# Patient Record
Sex: Female | Born: 1965 | Hispanic: Yes | Marital: Single
Health system: Southern US, Community
[De-identification: ages and names within clinical notes are randomized; demographics above are authoritative.]

---

## 2005-03-31 ENCOUNTER — Emergency Department: Payer: Self-pay | Admitting: Emergency Medicine

## 2012-02-03 ENCOUNTER — Observation Stay: Payer: Self-pay | Admitting: Surgery

## 2012-02-03 LAB — TROPONIN I: Troponin-I: <0.02 ng/mL

## 2012-02-03 LAB — CBC
HGB: 13.8 g/dL (ref 12.0–16.0)
MCHC: 32 g/dL (ref 32.0–36.0)
MCV: 94 fL (ref 80–100)
RBC: 4.59 10*6/uL (ref 3.80–5.20)
WBC: 12.5 10*3/uL — ABNORMAL HIGH (ref 3.6–11.0)

## 2012-02-03 LAB — URINALYSIS, COMPLETE
Bilirubin,UR: NEGATIVE
Glucose,UR: NEGATIVE mg/dL (ref 0–75)
Nitrite: NEGATIVE
RBC,UR: 1 /HPF (ref 0–5)
WBC UR: <1 /HPF (ref 0–5)

## 2012-02-03 LAB — COMPREHENSIVE METABOLIC PANEL
Alkaline Phosphatase: 113 U/L (ref 50–136)
Chloride: 103 mmol/L (ref 98–107)
Creatinine: 0.61 mg/dL (ref 0.60–1.30)
EGFR (African American): >60
EGFR (Non-African Amer.): >60
SGOT(AST): 18 U/L (ref 15–37)
SGPT (ALT): 23 U/L
Total Protein: 8.4 g/dL — ABNORMAL HIGH (ref 6.4–8.2)

## 2012-02-03 LAB — LIPASE, BLOOD: Lipase: 87 U/L (ref 73–393)

## 2012-10-30 IMAGING — US ABDOMEN ULTRASOUND LIMITED
1 series · 14 of 25 positions shown · non-contrast
Comparison: none

REASON FOR EXAM: RUQ abdominal pain
COMMENTS:   Body Site: GB and Fossa, CBD, Head of Pancreas

[Series 1: abdomen ultrasound limited · 0.25mm/px · 14 of 65 slices shown]
[im 1/65]
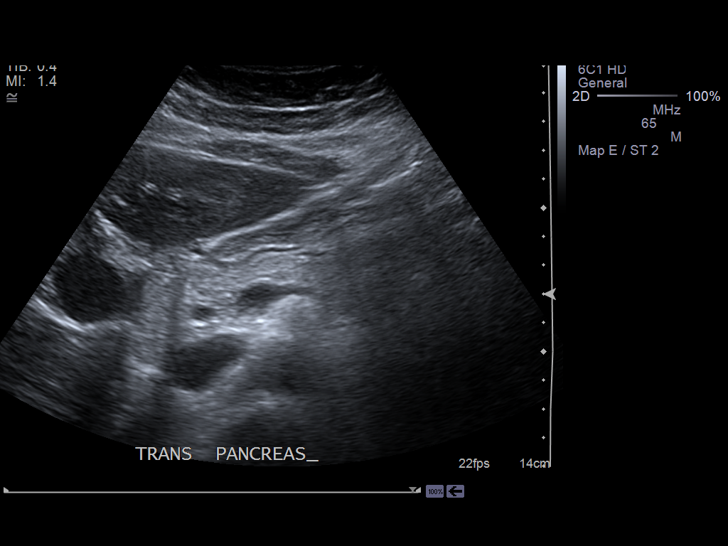
[im 6/65]
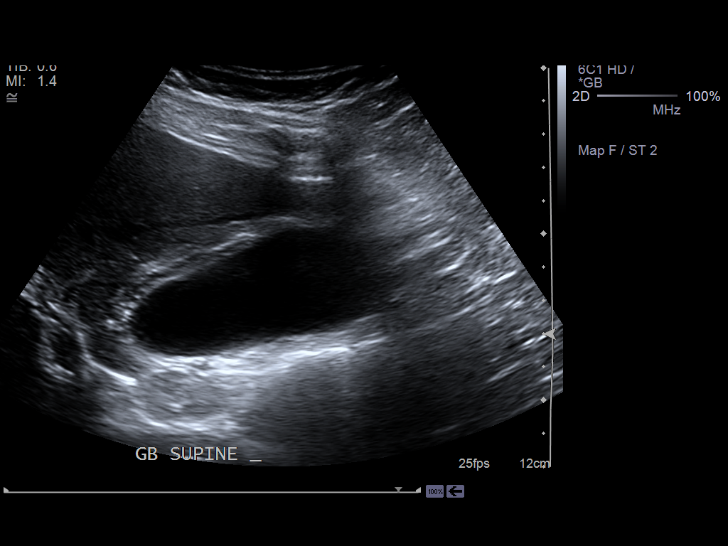
[im 11/65]
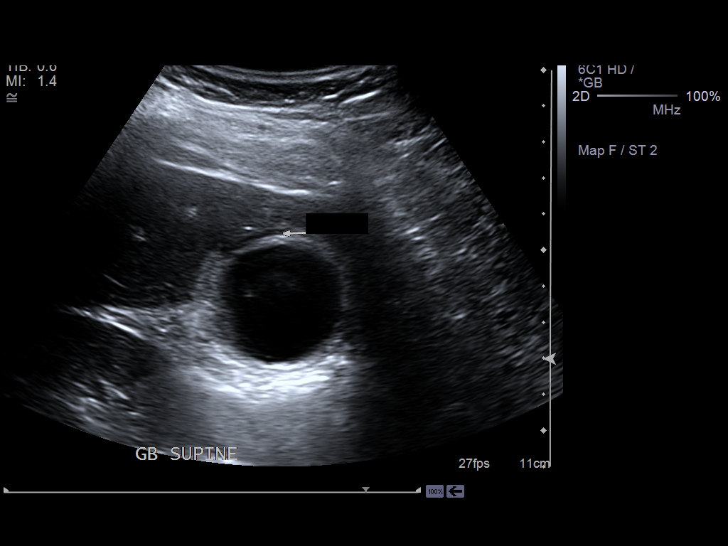
[im 17/65]
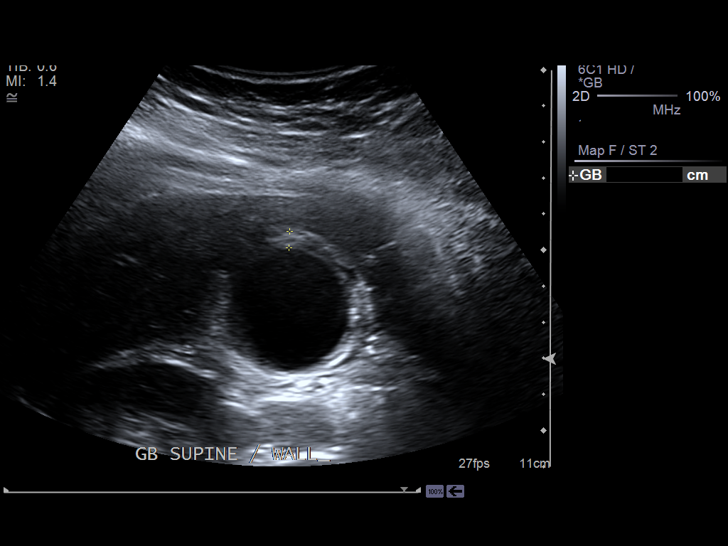
[im 22/65]
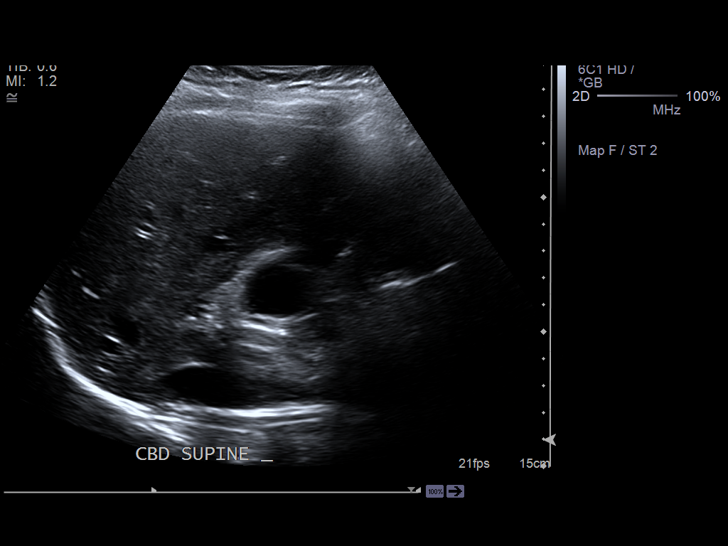
[im 25/65]
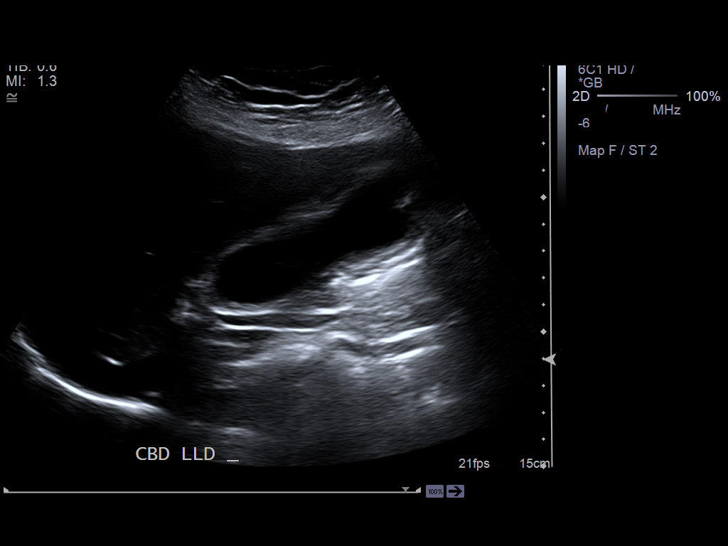
[im 30/65]
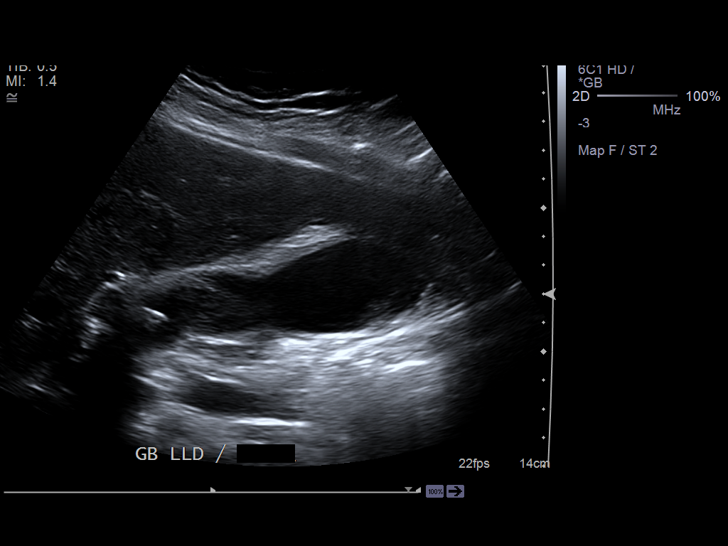
[im 35/65]
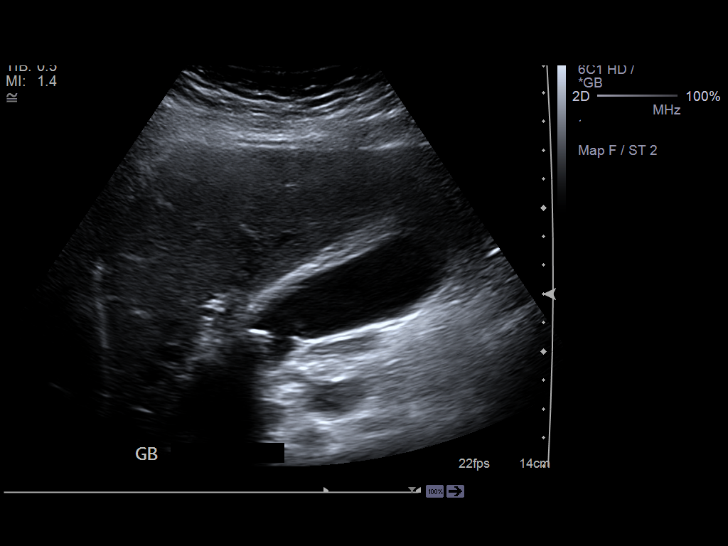
[im 41/65]
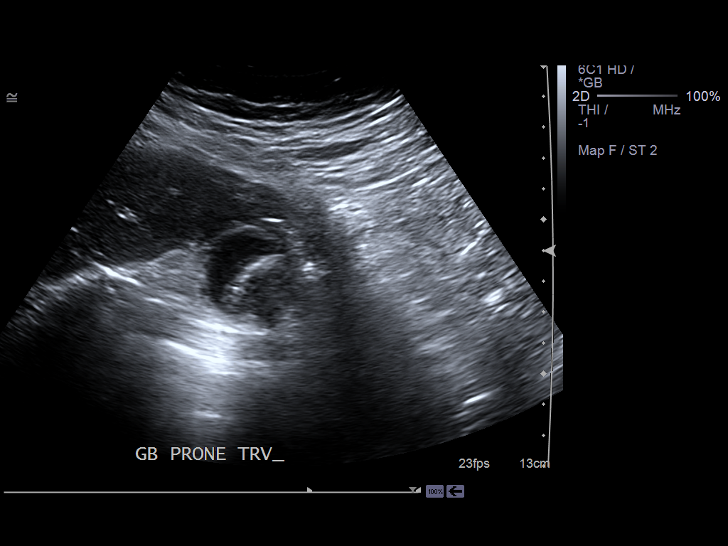
[im 43/65]
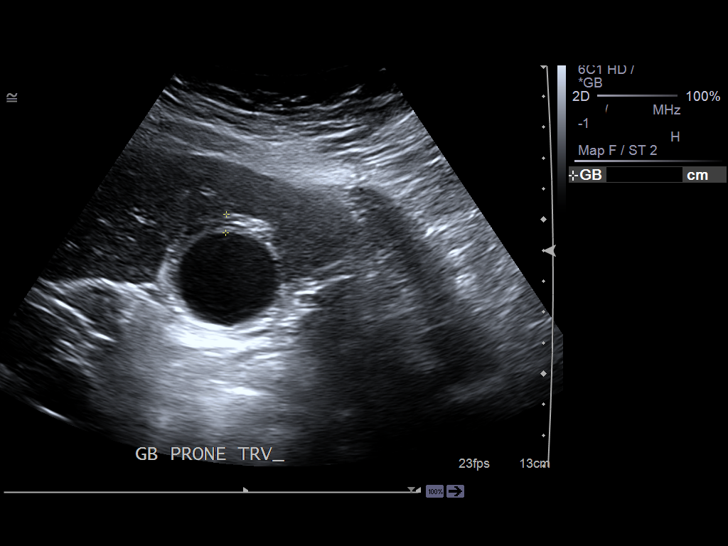
[im 49/65]
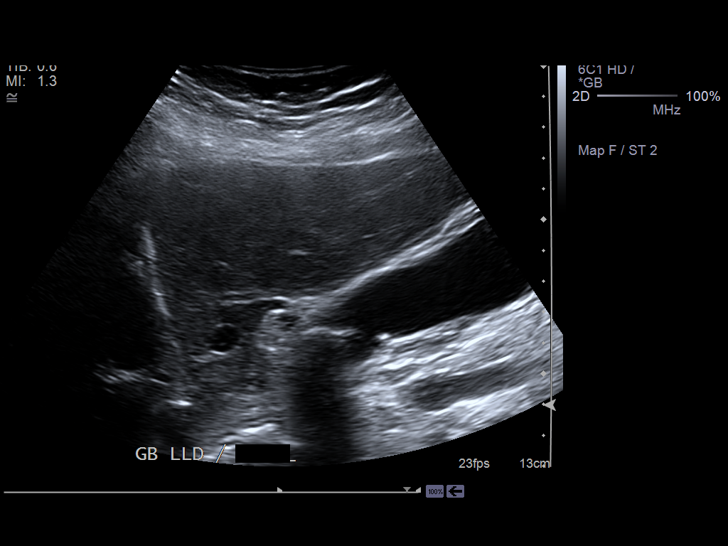
[im 54/65]
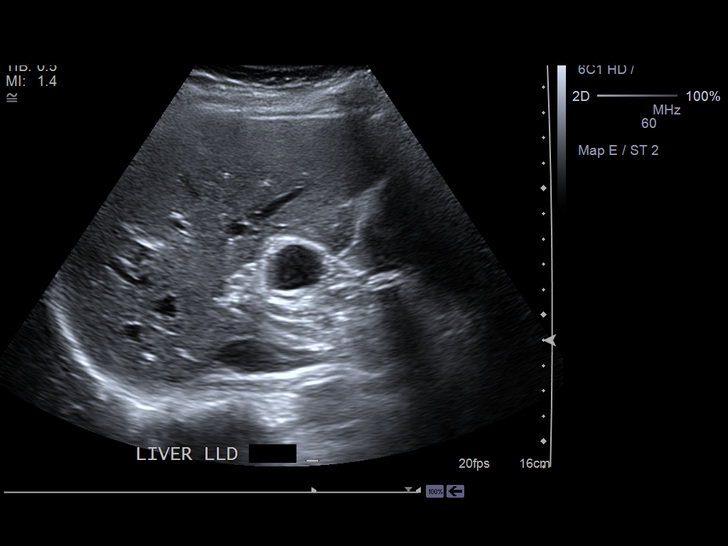
[im 59/65]
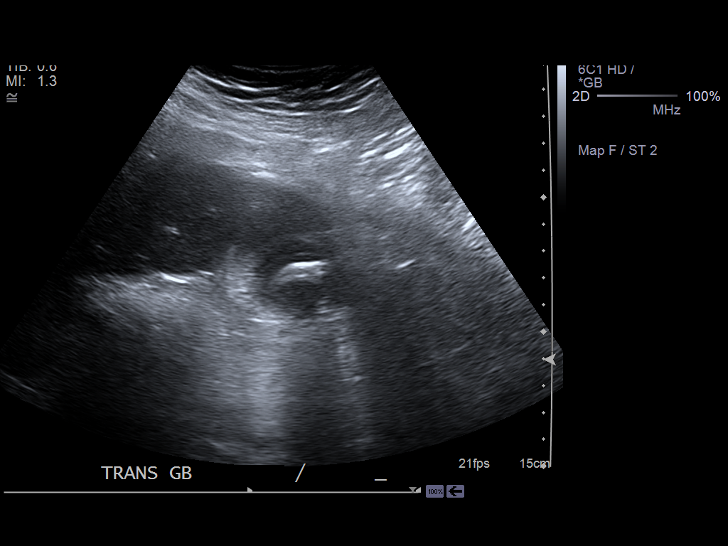
[im 65/65]
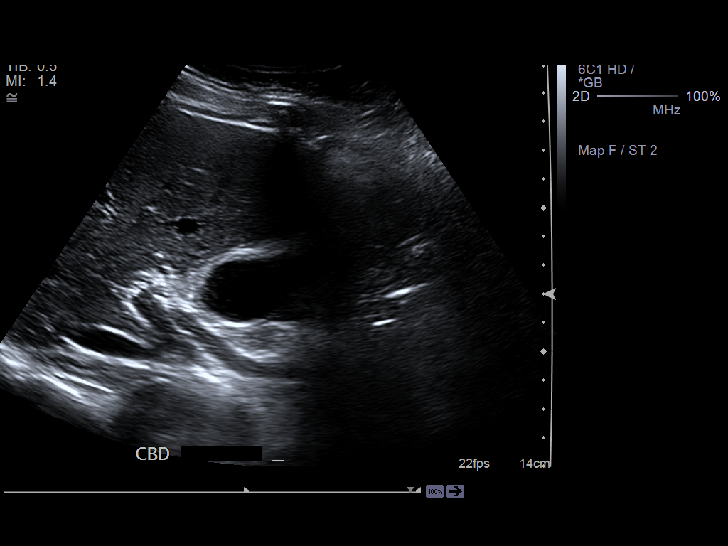

[14 of 25 positions shown; findings below may reference images not displayed]

PROCEDURE:     US  - US ABDOMEN LIMITED SURVEY  - February 03, 2012  [DATE]

RESULT:     Limited abdominal ultrasound was performed for evaluation of the
biliary tract. There are noted shadowing echodensities in the gallbladder
compatible with gallstones. One of the stones is located at the gallbladder
neck and does not move as the patient changes position. This stone measures
1.27 cm at maximum diameter and may be impacted in the gallbladder neck.
There is thickening of the gallbladder wall which measures 6 mm in
thickness. Pericholecystic fluid is observed. The findings noted are
consistent with cholecystitis. The common bile duct measures approximately 5
mm in diameter which is within the limits of normal. The head and body of
the pancreas are visualized and are normal in appearance. The pancreatic
tail is obscured by bowel gas.
IMPRESSION: 1. Cholelithiasis with there being a possible stone impacted in the
gallbladder neck.
2. There is thickening of the gallbladder wall and associated
pericholecystic fluid suspicious for cholecystitis.

[REDACTED]

## 2014-12-10 NOTE — H&P (Signed)
Subjective/Chief Complaint severe abdominal pain    History of Present Illness 46 yowf with a several day hx of abdominal pain, nausea and emesis,  Work-up in ER shows pericholecystic fluid and impacted stone.  Seen several years ago with similar type symptoms and no dx made until this admission.  Previous tubal ligation.    Past History obesity   Past Med/Surgical Hx:  None, patient reports no medical history.:   ALLERGIES:  No Known Allergies:     Other Allergies none   Family and Social History:   Family History Non-Contributory     Social History negative tobacco, negative ETOH, negative Illicit drugs    Place of Living Home    Review of Systems:   Subjective/Chief Complaint see above    Abdominal Pain Yes     Medications/Allergies Reviewed Medications/Allergies reviewed    Physical Exam:   GEN no acute distress, obese, disheveled, ill appearing.    HEENT pale conjunctivae    NECK supple  No masses     RESP normal resp effort  clear BS     CARD regular rate     ABD positive tenderness  positive Flank Tenderness  no hernia  soft  normal BS  murphy's sign present.     LYMPH negative neck    EXTR negative cyanosis/clubbing    SKIN normal to palpation    NEURO cranial nerves intact    PSYCH A+O to time, place, person   Lab Results:  Hepatic:  18-Jun-13 08:12    Bilirubin, Total 0.5   Alkaline Phosphatase 113   SGPT (ALT) 23 (12-78 NOTE: NEW REFERENCE RANGE 07/11/2011)   SGOT (AST) 18   Total Protein, Serum  8.4   Albumin, Serum 4.0  Routine Chem:  18-Jun-13 08:12    Glucose, Serum  119   BUN 11   Creatinine (comp) 0.61   Sodium, Serum 138   Potassium, Serum 3.8   Chloride, Serum 103   CO2, Serum 27   Calcium (Total), Serum 9.0   Osmolality (calc) 276   eGFR (African American) >60   eGFR (Non-African American) >60 (eGFR values <14m/min/1.73 m2 may be an indication of chronic kidney disease (CKD). Calculated eGFR is useful in patients  with stable renal function. The eGFR calculation will not be reliable in acutely ill patients when serum creatinine is changing rapidly. It is not useful in  patients on dialysis. The eGFR calculation may not be applicable to patients at the low and high extremes of body sizes, pregnant women, and vegetarians.)   Anion Gap 8   Lipase 87 (Result(s) reported on 03 Feb 2012 at 08:48AM.)  Cardiac:  18-Jun-13 08:12    Troponin I < 0.02 (0.00-0.05 0.05 ng/mL or less: NEGATIVE  Repeat testing in 3-6 hrs  if clinically indicated. >0.05 ng/mL: POTENTIAL  MYOCARDIAL INJURY. Repeat  testing in 3-6 hrs if  clinically indicated. NOTE: An increase or decrease  of 30% or more on serial  testing suggests a  clinically important change)  Routine UA:  18-Jun-13 08:12    Color (UA) Yellow   Clarity (UA) Hazy   Glucose (UA) Negative   Bilirubin (UA) Negative   Ketones (UA) Trace   Specific Gravity (UA) 1.021   Blood (UA) Negative   pH (UA) 6.0   Protein (UA) Negative   Nitrite (UA) Negative   Leukocyte Esterase (UA) Negative (Result(s) reported on 03 Feb 2012 at 08:46AM.)   RBC (UA) 1 /HPF   WBC (UA) <  1 /HPF   Bacteria (UA) NONE SEEN   Epithelial Cells (UA) 3 /HPF   Mucous (UA) PRESENT   Amorphous Crystal (UA) PRESENT (Result(s) reported on 03 Feb 2012 at 08:46AM.)  Routine Sero:  18-Jun-13 08:12    Pregnancy Test, Urine NEGATIVE (The results of the qualitative urine HCG (Pregnancy Test) should be evaluated in light of other clinical information.  There are limitations to the test which, in certain clinical situations, may result in a false positive or negative result. Thehigh dose hook effect can occur in urine samples with extremely high HCG concentrations.  This effect can produce a negative result in certain situations. It is suggested that results of the qualitative HCG be confirmed by an alternate methodology, such as the quantitative serum beta HCG test.)  Routine Hem:   18-Jun-13 08:12    WBC (CBC)  12.5   RBC (CBC) 4.59   Hemoglobin (CBC) 13.8   Hematocrit (CBC) 43.1   Platelet Count (CBC) 278 (Result(s) reported on 03 Feb 2012 at 08:44AM.)   MCV 94   MCH 30.0   MCHC 32.0   RDW 13.7   Radiology Results:  XRay:    18-Jun-13 13:00, Chest PA and Lateral   Chest PA and Lateral   REASON FOR EXAM:    epigastric pain/chest pain  COMMENTS:       PROCEDURE: DXR - DXR CHEST PA (OR AP) AND LATERAL  - Feb 03 2012  1:00PM     RESULT: The lung fields are clear. The heart, mediastinal and osseous   structures reveal no significant abnormalities.    IMPRESSION:     No significant abnormalities are noted.    Thank you for the opportunity to contribute to the care of your patient.     Dictation site: 2     Verified By: Dionne Ano WALL, M.D., MD  [place]:    18-Jun-13 13:58, US Abdomen Limited Survey   US Abdomen Limited Survey   REASON FOR EXAM:    RUQ abdominal pain  COMMENTS:   Body Site: GB and Fossa, CBD, Head of Pancreas    PROCEDURE: [place]  - US ABDOMEN LIMITED SURVEY  - Feb 03 2012  1:58PM     RESULT: Limited abdominal ultrasound was performed for evaluation of the   biliary tract. There are noted shadowing echodensities in the gallbladder   compatible with gallstones. One of the stones is located at the   gallbladder neck and does not move as the patient changes position. This   stone measures 1.27 cm at maximum diameter and may be impacted in the   gallbladder neck. There is thickening of the gallbladder wall which   measures 6 mm in thickness. Pericholecystic fluid is observed. The   findings noted are consistent with cholecystitis. The common bile duct   measures approximately 5 mm in diameter which is within the limits of   normal. The head and body of the pancreas are visualized and are normal     in appearance. The pancreatic tail is obscured by bowel gas.    IMPRESSION:   1. Cholelithiasis with there being a possible stone impacted in the    gallbladder neck.  2. There is thickening of the gallbladder wall and associated   pericholecystic fluid suspicious for cholecystitis.      Thank you for the opportunity to contribute to the care of your patient.       Dictation site: 2  Verified By: Dionne Ano WALL, M.D., MD     Assessment/Admission Diagnosis 63 yowf with acute calculus cholecystitis obesity    Plan admit, hydrate, pain control , anit-emetics, lap chole with Dr. Dois Davenport. total time spent 45 minutes   Electronic Signatures: Sherri Rad (MD)  (Signed 19-Jun-13 08:31)  Authored: CHIEF COMPLAINT and HISTORY, PAST MEDICAL/SURGIAL HISTORY, ALLERGIES, Other Allergies, HOME MEDICATIONS, FAMILY AND SOCIAL HISTORY, REVIEW OF SYSTEMS, PHYSICAL EXAM, LABS, Radiology, ASSESSMENT AND PLAN   Last Updated: 19-Jun-13 08:31 by Sherri Rad (MD)

## 2014-12-10 NOTE — Op Note (Signed)
PATIENT NAME:  Whitney Owens, Whitney Owens MR#:  [id number] DATE OF BIRTH:  [birth year]  DATE OF PROCEDURE:  02/04/2012  OPERATION PERFORMED: Laparoscopic cholecystectomy.   PREOPERATIVE DIAGNOSIS: Acute cholecystitis.   POSTOPERATIVE DIAGNOSES:  1. Acute cholecystitis.  2. Gallbladder hydrops.   SURGEON: Claude MangesWilliam F. Lakitha Gordy, MD  ANESTHESIA: General.   PROCEDURE IN DETAIL: The patient was placed supine on the Operating Room table and prepped and draped in the usual sterile fashion. A 15 mmHg CO2 pneumoperitoneum was created via a Veress needle in the infraumbilical position and this was replaced with a 5 mm trocar and a 30 degree angled laparoscope. Remaining trocars were placed under direct visualization. The gallbladder was tense and distended and thick walled with some exudate surrounding it. There were multiple adhesions to the entire visceral surface of the gallbladder with the exception of the very tip of the fundus. Some of these were quite dense and chronic and others were more acute and significantly edematous. The gallbladder was decompressed of clear bile and then the fundus was grasped and retracted superiorly and ventrally and all of these adhesions were taken down bluntly with the aid of electrocautery. The infundibulum of the gallbladder was seen to contain a very large solitary stone that could not be moved for manipulation. Nonetheless, the gallbladder infundibulum was retracted laterally and dissection in the triangle of Calot revealed a long cystic artery that bifurcated quite high on the gallbladder wall. This was triply clipped and divided. The gallbladder cystic junction was identified and the cystic duct was doubly clipped with Hem-o-lok clips and divided. The gallbladder was removed from the liver bed with electrocautery. In doing so it was extremely densely adherent to the liver bed and it is possible that I left a subcentimeter area of gallbladder wall left on the liver bed up near the  fundus. After this dissection, hemostasis was assured with electrocautery and the gallbladder and multiple large stones were placed into the Endo Catch bag and extracted from the abdomen via the epigastric port. This port site fascia was to the right of the midline and required significant enlargement due to the size of the stones and the thickness of the gallbladder wall. Then a laparoscopic puncture closure device was used to place running suture line of #1 Vicryl in this fascia and the peritoneum was irrigated with copious amounts of warm normal saline particularly under the fascial closure and in the right upper quadrant. This was completely suctioned clear. The previous areas of dissection and the previous clips were placed were all secure and hemostatic. The peritoneum was then desufflated and decannulated and all four skin sites were closed with subcuticular 5-0 Monocryl and suture strips. The patient tolerated the procedure well. There were no complications.  ____________________________ Claude MangesWilliam F. Yordan Martindale, MD wfm:cms D: 02/04/2012 12:45:09 ET T: 02/04/2012 12:53:17 ET JOB#: [id number]  cc: Claude MangesWilliam F. Haylen Bellotti, MD, <Dictator> Claude MangesWILLIAM F Lestine Rahe MD ELECTRONICALLY SIGNED 02/05/2012 7:37

## 2014-12-10 NOTE — H&P (Signed)
PATIENT NAMEGORDANA, Owens MR#:  [id number] DATE OF BIRTH:  June 08, 1966  DATE OF ADMISSION:  02/03/2012  ADMITTING DIAGNOSES:  1. Abdominal pain.  2. Obesity.  3. Acute calculus cholecystitis.   HISTORY: This is a 49 year old Hispanic female who presented to the emergency room with a two-day history of gradual onset of epigastric and right upper quadrant abdominal pain. This followed a meal. The pain is severe. It occurred shortly after a meal of tamales. She has had some mild nausea but no vomiting. She has had some low-grade fever, subjective jaundice. The patient has had continued pain since two days ago. She was brought to the emergency room by her family. She describes the pain as tightness and bloating in her upper abdomen. She has a history of obesity and previous history of cesarean section. Of note, she was carried to the emergency room several years ago by her family with similar type abdominal pain and discharged home without a specific diagnosis. In the past, the patient has had intermittent episodes over the last several years of postprandial abdominal pain similar in nature but not as severe especially following fatty or spicy meals.  PAST MEDICAL HISTORY: Obesity.   PAST SURGICAL HISTORY: Cesarean section.   FAMILY HISTORY: Significant for gallstones in her maternal grandmother.   SOCIAL HISTORY: The patient has two children, does not smoke, does not drink, and is employed.   REVIEW OF SYSTEMS: Significant for abdominal pain. Positive for nausea. No emesis, no change in bowel habits, no sick contacts, and no rash. Low-grade fever. No chest pain. No shortness of breath. Remaining ten-point review is unremarkable   PHYSICAL EXAMINATION:   GENERAL: The patient is alert and oriented, in no obvious distress. She looks mildly uncomfortable.   VITAL SIGNS: Temperature 96.5, pulse 59, respiratory rate 18, blood pressure 142/78, and weight 145 pounds.   HEENT: Conjunctivae are clear.  Pupils are equal and reactive.   NECK: Supple. No adenopathy.   LUNGS: Clear bilaterally with normal respiratory effort.  No extrapulmonary sounds.   HEART: Regular rate and rhythm. No murmurs.   ABDOMEN: Abdomen demonstrates no hepatosplenomegaly. There is a healed surgical incision. There is significant tenderness to deep palpation in the right upper quadrant consistent with a Murphy's sign.   SKIN: Warm and well perfused.   NEUROLOGIC: Examination grossly normal. Cranial nerves are intact.   PSYCHIATRIC: Normal mood, judgment, and affect.  LABORATORY AND RADIOLOGIC DATA: Liver function tests are normal.   Urinalysis is normal.   Lipase is 87. White count is 12.5, hemoglobin 13.8, and platelet count 278,000. Electrolytes are unremarkable.   Review of x-rays and ultrasound was obtained which I personally reviewed. There is a stone obstructing the neck of the gallbladder measuring greater than 1 cm. There is pericholecystic fluid. There is gallbladder wall thickening. The common bile duct measures 5 mm.  I personally interpreted the images and agree with the interpretation.   In addition, a chest x-ray, PA and lateral, was obtained demonstrating normal appearing lung fields and cardiac size.   IMPRESSION: A 49 year old Hispanic female with acute calculus cholecystitis.  1. Acute calculus cholecystitis.  2. Obesity.   PLAN: The patient will be admitted, hydrated, and made n.p.o. after midnight. Intravenous Unasyn and subcutaneous heparin will be ordered. She has been added on to the schedule for Dr. Anda Kraft, my associate, for tomorrow for laparoscopic cholecystectomy. I will have him discuss with her the procedure and for him to obtain informed consent, as he will be  doing the operation.   TOTAL TIME SPENT: 45 minutes.  ____________________________ Redge GainerMark A. Egbert GaribaldiBird, MD FACS mab:slb D: 02/03/2012 20:20:50 ET T: 02/04/2012 08:31:11 ET JOB#: [id number]  cc: Loraine LericheMark A. Egbert GaribaldiBird, MD,  <Dictator> Brixon Zhen A Gudelia Eugene MD ELECTRONICALLY SIGNED 02/05/2012 23:09
